# Patient Record
Sex: Male | Born: 1955 | Race: Black or African American | Hispanic: No | Smoking: Current every day smoker
Health system: Southern US, Community
[De-identification: ages and names within clinical notes are randomized; demographics above are authoritative.]

## PROBLEM LIST (undated history)

## (undated) DIAGNOSIS — I10 Essential (primary) hypertension: Secondary | ICD-10-CM

## (undated) DIAGNOSIS — E119 Type 2 diabetes mellitus without complications: Secondary | ICD-10-CM

## (undated) HISTORY — PX: BELOW KNEE LEG AMPUTATION: SUR23

---

## 2015-09-08 ENCOUNTER — Inpatient Hospital Stay
Admission: EM | Admit: 2015-09-08 | Discharge: 2015-09-09 | DRG: 292 | Disposition: A | Discharge status: Skilled Nursing Facility | Payer: Medicare Other | Attending: Internal Medicine | Admitting: Internal Medicine

## 2015-09-08 ENCOUNTER — Inpatient Hospital Stay
Admit: 2015-09-08 | Discharge: 2015-09-08 | Disposition: A | Discharge status: Home / Self Care | Payer: Medicare Other | Attending: Internal Medicine | Admitting: Internal Medicine

## 2015-09-08 ENCOUNTER — Emergency Department: Payer: Medicare Other

## 2015-09-08 ENCOUNTER — Encounter: Payer: Self-pay | Admitting: Emergency Medicine

## 2015-09-08 DIAGNOSIS — M47892 Other spondylosis, cervical region: Secondary | ICD-10-CM | POA: Diagnosis present

## 2015-09-08 DIAGNOSIS — I5021 Acute systolic (congestive) heart failure: Secondary | ICD-10-CM | POA: Diagnosis present

## 2015-09-08 DIAGNOSIS — I110 Hypertensive heart disease with heart failure: Principal | ICD-10-CM | POA: Diagnosis present

## 2015-09-08 DIAGNOSIS — E1151 Type 2 diabetes mellitus with diabetic peripheral angiopathy without gangrene: Secondary | ICD-10-CM | POA: Diagnosis present

## 2015-09-08 DIAGNOSIS — R0902 Hypoxemia: Secondary | ICD-10-CM | POA: Diagnosis present

## 2015-09-08 DIAGNOSIS — Z7982 Long term (current) use of aspirin: Secondary | ICD-10-CM | POA: Diagnosis not present

## 2015-09-08 DIAGNOSIS — Z833 Family history of diabetes mellitus: Secondary | ICD-10-CM | POA: Diagnosis not present

## 2015-09-08 DIAGNOSIS — R0600 Dyspnea, unspecified: Secondary | ICD-10-CM | POA: Diagnosis present

## 2015-09-08 DIAGNOSIS — R0602 Shortness of breath: Secondary | ICD-10-CM | POA: Diagnosis present

## 2015-09-08 DIAGNOSIS — E871 Hypo-osmolality and hyponatremia: Secondary | ICD-10-CM | POA: Diagnosis present

## 2015-09-08 DIAGNOSIS — E1140 Type 2 diabetes mellitus with diabetic neuropathy, unspecified: Secondary | ICD-10-CM | POA: Diagnosis present

## 2015-09-08 DIAGNOSIS — I509 Heart failure, unspecified: Secondary | ICD-10-CM

## 2015-09-08 DIAGNOSIS — Z794 Long term (current) use of insulin: Secondary | ICD-10-CM

## 2015-09-08 DIAGNOSIS — Z89519 Acquired absence of unspecified leg below knee: Secondary | ICD-10-CM

## 2015-09-08 DIAGNOSIS — F17210 Nicotine dependence, cigarettes, uncomplicated: Secondary | ICD-10-CM | POA: Diagnosis present

## 2015-09-08 DIAGNOSIS — Z79899 Other long term (current) drug therapy: Secondary | ICD-10-CM

## 2015-09-08 DIAGNOSIS — Z89512 Acquired absence of left leg below knee: Secondary | ICD-10-CM

## 2015-09-08 HISTORY — DX: Type 2 diabetes mellitus without complications: E11.9

## 2015-09-08 HISTORY — DX: Essential (primary) hypertension: I10

## 2015-09-08 LAB — CBC WITH DIFFERENTIAL/PLATELET
BASOS ABS: 0.3 10*3/uL — AB (ref 0–0.1)
EOS ABS: 0.6 10*3/uL (ref 0–0.7)
Eosinophils Relative: 6 %
HEMATOCRIT: 39.7 % — AB (ref 40.0–52.0)
HEMOGLOBIN: 13.7 g/dL (ref 13.0–18.0)
Lymphocytes Relative: 21 %
Lymphs Abs: 2 10*3/uL (ref 1.0–3.6)
MCH: 31.4 pg (ref 26.0–34.0)
MCHC: 34.4 g/dL (ref 32.0–36.0)
MCV: 91.2 fL (ref 80.0–100.0)
Monocytes Absolute: 0.7 10*3/uL (ref 0.2–1.0)
NEUTROS ABS: 5.9 10*3/uL (ref 1.4–6.5)
PLATELETS: 280 10*3/uL (ref 150–440)
RBC: 4.35 MIL/uL — AB (ref 4.40–5.90)
RDW: 14.2 % (ref 11.5–14.5)
WBC: 9.5 10*3/uL (ref 3.8–10.6)

## 2015-09-08 LAB — ECHOCARDIOGRAM COMPLETE
Height: 70 in
Weight: 3728 oz

## 2015-09-08 LAB — GLUCOSE, CAPILLARY
GLUCOSE-CAPILLARY: 258 mg/dL — AB (ref 65–99)
GLUCOSE-CAPILLARY: 417 mg/dL — AB (ref 65–99)
GLUCOSE-CAPILLARY: 282 mg/dL — AB (ref 65–99)
Glucose-Capillary: 100 mg/dL — ABNORMAL HIGH (ref 65–99)
Glucose-Capillary: 141 mg/dL — ABNORMAL HIGH (ref 65–99)

## 2015-09-08 LAB — TROPONIN I
Troponin I: <0.03 ng/mL (ref <0.031)
Troponin I: <0.03 ng/mL (ref <0.031)

## 2015-09-08 LAB — COMPREHENSIVE METABOLIC PANEL
ALK PHOS: 118 U/L (ref 38–126)
ALT: 9 U/L — ABNORMAL LOW (ref 17–63)
ANION GAP: 7 (ref 5–15)
AST: 14 U/L — ABNORMAL LOW (ref 15–41)
Albumin: 3.9 g/dL (ref 3.5–5.0)
BUN: 22 mg/dL — ABNORMAL HIGH (ref 6–20)
CALCIUM: 9.4 mg/dL (ref 8.9–10.3)
CO2: 23 mmol/L (ref 22–32)
Chloride: 100 mmol/L — ABNORMAL LOW (ref 101–111)
Creatinine, Ser: 0.79 mg/dL (ref 0.61–1.24)
Glucose, Bld: 227 mg/dL — ABNORMAL HIGH (ref 65–99)
Potassium: 4.3 mmol/L (ref 3.5–5.1)
SODIUM: 130 mmol/L — AB (ref 135–145)
TOTAL PROTEIN: 7.3 g/dL (ref 6.5–8.1)
Total Bilirubin: 0.6 mg/dL (ref 0.3–1.2)

## 2015-09-08 LAB — BRAIN NATRIURETIC PEPTIDE: B Natriuretic Peptide: 392 pg/mL — ABNORMAL HIGH (ref 0.0–100.0)

## 2015-09-08 MED ORDER — TRAZODONE HCL 100 MG PO TABS
100.0000 mg | ORAL_TABLET | Freq: Every day | ORAL | Status: DC
  Administered 2015-09-08: 100 mg via ORAL
  Filled 2015-09-08: qty 1

## 2015-09-08 MED ORDER — TAMSULOSIN HCL 0.4 MG PO CAPS
0.4000 mg | ORAL_CAPSULE | Freq: Every day | ORAL | Status: DC
  Administered 2015-09-08 – 2015-09-09 (×2): 0.4 mg via ORAL
  Filled 2015-09-08 (×2): qty 1

## 2015-09-08 MED ORDER — ASPIRIN EC 81 MG PO TBEC
81.0000 mg | DELAYED_RELEASE_TABLET | Freq: Every day | ORAL | Status: DC
  Administered 2015-09-08 – 2015-09-09 (×2): 81 mg via ORAL
  Filled 2015-09-08 (×2): qty 1

## 2015-09-08 MED ORDER — FUROSEMIDE 10 MG/ML IJ SOLN
40.0000 mg | Freq: Two times a day (BID) | INTRAMUSCULAR | Status: DC
  Administered 2015-09-08 – 2015-09-09 (×3): 40 mg via INTRAVENOUS
  Filled 2015-09-08 (×3): qty 4

## 2015-09-08 MED ORDER — POTASSIUM CHLORIDE CRYS ER 20 MEQ PO TBCR
20.0000 meq | EXTENDED_RELEASE_TABLET | Freq: Every day | ORAL | Status: DC
  Administered 2015-09-08 – 2015-09-09 (×2): 20 meq via ORAL
  Filled 2015-09-08 (×2): qty 1

## 2015-09-08 MED ORDER — ENOXAPARIN SODIUM 40 MG/0.4ML ~~LOC~~ SOLN
40.0000 mg | SUBCUTANEOUS | Status: DC
  Administered 2015-09-08 – 2015-09-09 (×2): 40 mg via SUBCUTANEOUS
  Filled 2015-09-08 (×2): qty 0.4

## 2015-09-08 MED ORDER — SODIUM CHLORIDE 0.9% FLUSH: 3.0000 mL | INTRAVENOUS | Status: DC | PRN

## 2015-09-08 MED ORDER — SODIUM CHLORIDE 0.9% FLUSH
3.0000 mL | Freq: Two times a day (BID) | INTRAVENOUS | Status: DC
  Administered 2015-09-08 (×2): 3 mL via INTRAVENOUS

## 2015-09-08 MED ORDER — INSULIN ASPART 100 UNIT/ML ~~LOC~~ SOLN
10.0000 [IU] | Freq: Once | SUBCUTANEOUS | Status: AC
Start: 2015-09-08 — End: 2015-09-08
  Administered 2015-09-08: 10 [IU] via SUBCUTANEOUS
  Filled 2015-09-08: qty 10

## 2015-09-08 MED ORDER — CARVEDILOL 3.125 MG PO TABS
3.1250 mg | ORAL_TABLET | Freq: Two times a day (BID) | ORAL | Status: DC
  Administered 2015-09-08 – 2015-09-09 (×2): 3.125 mg via ORAL
  Filled 2015-09-08 (×2): qty 1

## 2015-09-08 MED ORDER — ACETAMINOPHEN 325 MG PO TABS: 650.0000 mg | ORAL_TABLET | ORAL | Status: DC | PRN

## 2015-09-08 MED ORDER — ALBUTEROL SULFATE (2.5 MG/3ML) 0.083% IN NEBU
5.0000 mg | INHALATION_SOLUTION | Freq: Once | RESPIRATORY_TRACT | Status: AC
  Administered 2015-09-08: 5 mg via RESPIRATORY_TRACT
  Filled 2015-09-08: qty 6

## 2015-09-08 MED ORDER — SODIUM CHLORIDE 0.9 % IV SOLN: 250.0000 mL | INTRAVENOUS | Status: DC | PRN

## 2015-09-08 MED ORDER — FUROSEMIDE 10 MG/ML IJ SOLN
60.0000 mg | Freq: Once | INTRAMUSCULAR | Status: AC
  Administered 2015-09-08: 60 mg via INTRAVENOUS
  Filled 2015-09-08: qty 8

## 2015-09-08 MED ORDER — SPIRONOLACTONE 25 MG PO TABS
25.0000 mg | ORAL_TABLET | Freq: Every day | ORAL | Status: DC
  Administered 2015-09-08: 25 mg via ORAL
  Filled 2015-09-08: qty 1

## 2015-09-08 MED ORDER — NITROGLYCERIN 2 % TD OINT: 1.0000 [in_us] | TOPICAL_OINTMENT | Freq: Once | TRANSDERMAL | Status: DC

## 2015-09-08 MED ORDER — INSULIN GLARGINE 100 UNIT/ML ~~LOC~~ SOLN
15.0000 [IU] | Freq: Two times a day (BID) | SUBCUTANEOUS | Status: DC
  Administered 2015-09-08 – 2015-09-09 (×3): 15 [IU] via SUBCUTANEOUS
  Filled 2015-09-08 (×6): qty 0.15

## 2015-09-08 MED ORDER — INSULIN ASPART 100 UNIT/ML ~~LOC~~ SOLN
0.0000 [IU] | Freq: Three times a day (TID) | SUBCUTANEOUS | Status: DC
  Administered 2015-09-08 – 2015-09-09 (×3): 5 [IU] via SUBCUTANEOUS
  Filled 2015-09-08 (×2): qty 5
  Filled 2015-09-08: qty 3

## 2015-09-08 MED ORDER — INSULIN ASPART 100 UNIT/ML ~~LOC~~ SOLN: 0.0000 [IU] | Freq: Every day | SUBCUTANEOUS | Status: DC

## 2015-09-08 MED ORDER — ONDANSETRON HCL 4 MG/2ML IJ SOLN: 4.0000 mg | Freq: Four times a day (QID) | INTRAMUSCULAR | Status: DC | PRN

## 2015-09-08 MED ORDER — INSULIN REGULAR HUMAN 100 UNIT/ML IJ SOLN: 10.0000 [IU] | Freq: Once | INTRAMUSCULAR | Status: DC

## 2015-09-08 MED ORDER — LISINOPRIL 5 MG PO TABS
5.0000 mg | ORAL_TABLET | Freq: Every day | ORAL | Status: DC
  Administered 2015-09-08 – 2015-09-09 (×2): 5 mg via ORAL
  Filled 2015-09-08 (×2): qty 1

## 2015-09-08 MED ORDER — ATORVASTATIN CALCIUM 20 MG PO TABS
20.0000 mg | ORAL_TABLET | Freq: Every day | ORAL | Status: DC
  Administered 2015-09-08: 20 mg via ORAL
  Filled 2015-09-08 (×2): qty 1

## 2015-09-08 NOTE — Progress Notes (Signed)
*  PRELIMINARY RESULTS* Echocardiogram 2D Echocardiogram has been performed.  Brett Mccann 09/08/2015, 1:55 PM

## 2015-09-08 NOTE — Progress Notes (Signed)
Patient admitted to unit from ED, for dyspnea and SOB,. Patient alert and oriented, denies any pain at this time, vss, mood calm will continue to monitor \.

## 2015-09-08 NOTE — Progress Notes (Signed)
Patient BS 417, MD notified, new order received, will continue to monitor.

## 2015-09-08 NOTE — NC FL2 (Signed)
[place] MEDICAID FL2 LEVEL OF CARE SCREENING TOOL     IDENTIFICATION  Patient Name: Brett Mccann Birthdate: [birth year] Sex: male Admission Date (Current Location): 09/08/2015  [place] and [place] Number:  Chiropodist and Address:  [facility], [address] [place], [place] [address]      Provider Number: [id number]  Attending Physician Name and Address:  Altamese Dilling, MD  Relative Name and Phone Number:       Current Level of Care: Hospital Recommended Level of Care: Skilled Nursing Facility Prior Approval Number:    Date Approved/Denied:   PASRR Number: [id number]  Discharge Plan: SNF    Current Diagnoses: Patient Active Problem List   Diagnosis Date Noted  . Dyspnea 09/08/2015  . CHF (congestive heart failure) (HCC) 09/08/2015    Orientation RESPIRATION BLADDER Height & Weight     Self, Situation, Place  Normal, O2 (2 liters) Incontinent Weight: 233 lb (105.688 kg) Height:   (177.8 cm)  BEHAVIORAL SYMPTOMS/MOOD NEUROLOGICAL BOWEL NUTRITION STATUS   (none)  (none) Incontinent Diet (heart healthy/carb modified)  AMBULATORY STATUS COMMUNICATION OF NEEDS Skin   Extensive Assist Verbally Normal                       Personal Care Assistance Level of Assistance  Dressing, Bathing Bathing Assistance: Limited assistance   Dressing Assistance: Limited assistance     Functional Limitations Info             SPECIAL CARE FACTORS FREQUENCY                       Contractures Contractures Info: Not present    Additional Factors Info  Code Status, Allergies Code Status Info: full Allergies Info: nka           Current Medications (09/08/2015):  This is the current hospital active medication list Current Facility-Administered Medications  Medication Dose Route Frequency Provider Last Rate Last Dose  . 0.9 %  sodium chloride infusion  250 mL Intravenous PRN Ihor Austin, MD      .  acetaminophen (TYLENOL) tablet 650 mg  650 mg Oral Q4H PRN Ihor Austin, MD      . aspirin EC tablet 81 mg  81 mg Oral Daily Pavan Pyreddy, MD   81 mg at 09/08/15 1004  . atorvastatin (LIPITOR) tablet 20 mg  20 mg Oral q1800 Altamese Dilling, MD      . carvedilol (COREG) tablet 3.125 mg  3.125 mg Oral BID WC Pavan Pyreddy, MD      . enoxaparin (LOVENOX) injection 40 mg  40 mg Subcutaneous Q24H Pavan Pyreddy, MD   40 mg at 09/08/15 1004  . furosemide (LASIX) injection 40 mg  40 mg Intravenous Q12H Pavan Pyreddy, MD   40 mg at 09/08/15 1004  . insulin aspart (novoLOG) injection 0-9 Units  0-9 Units Subcutaneous TID WC Altamese Dilling, MD   5 Units at 09/08/15 1354  . insulin glargine (LANTUS) injection 15 Units  15 Units Subcutaneous BID Altamese Dilling, MD   15 Units at 09/08/15 1355  . lisinopril (PRINIVIL,ZESTRIL) tablet 5 mg  5 mg Oral Daily Ihor Austin, MD   5 mg at 09/08/15 1004  . nitroGLYCERIN (NITROGLYN) 2 % ointment 1 inch  1 inch Topical Once Jeanmarie Plant, MD   1 inch at 09/08/15 0508  . ondansetron (ZOFRAN) injection 4 mg  4 mg Intravenous Q6H PRN Ihor Austin, MD      .  potassium chloride SA (K-DUR,KLOR-CON) CR tablet 20 mEq  20 mEq Oral Daily Ihor AustinPavan Pyreddy, MD   20 mEq at 09/08/15 1004  . sodium chloride flush (NS) 0.9 % injection 3 mL  3 mL Intravenous Q12H Pavan Pyreddy, MD   3 mL at 09/08/15 1005  . sodium chloride flush (NS) 0.9 % injection 3 mL  3 mL Intravenous PRN Pavan Pyreddy, MD      . spironolactone (ALDACTONE) tablet 25 mg  25 mg Oral Daily Altamese DillingVaibhavkumar Destaney Sarkis, MD   25 mg at 09/08/15 1356     Discharge Medications: Please see discharge summary for a list of discharge medications.  Relevant Imaging Results:  Relevant Lab Results:   Additional Information    York SpanielMonica Marra, LCSW

## 2015-09-08 NOTE — Consult Note (Signed)
[facility]  CARDIOLOGY CONSULT NOTE  Patient ID: Brett Mccann MRN: [id number] DOB/AGE: 06/22/1955 60 y.o.  Admit date: 09/08/2015 Referring Physician Dr. Elisabeth PigeonVachhani Primary Physician   Primary Cardiologist [facility] Reason for Consultation chf  HPI: Mr. 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 Chest x-ray showed mild pulmonary edema.. He had a stress test approximately one year ago that showed no evidence of ischemia. There was inferior infarction versus attenuation artifact. His ejection fraction was 32%. He has been living at a nursing facility for the last several months. Patient states that his shortness of breath occurs when he is anxious. He has had a lot of anxiety attacks recently. He states he is compliant with his medications. He has ruled out for myocardial infarction with negative troponin. He denies chest pain orthopnea or PND. Echocardiogram is pending from this admission.   Review of Systems  HENT: Negative.   Eyes: Negative.   Respiratory: Positive for shortness of breath.   Cardiovascular: Negative.   Gastrointestinal:  Negative.   Genitourinary: Negative.   Musculoskeletal: Negative.   Skin: Negative.   Neurological: Positive for weakness.  Endo/Heme/Allergies: Negative.   Psychiatric/Behavioral: The patient is nervous/anxious.     Past Medical History  Diagnosis Date  . Diabetes mellitus without complication (HCC)   . Hypertension     Family History  Problem Relation Age of Onset  . Diabetes Father     Social History   Social History  . Marital Status: Widowed    Spouse Name: N/A  . Number of Children: N/A  . Years of Education: N/A   Occupational History  . disabled    Social History Main Topics  . Smoking status: Current Every Day Smoker  . Smokeless tobacco: Not on file  . Alcohol Use: No  . Drug Use: No  . Sexual Activity: Not on file   Other Topics Concern  . Not on file   Social History Narrative  . No narrative on file    Past Surgical History  Procedure Laterality Date  . Below knee leg amputation       Prescriptions prior to admission  Medication Sig Dispense Refill Last Dose  . acetaminophen (TYLENOL) 325 MG tablet Take 650 mg by mouth every 4 (four) hours as needed for mild pain.   prn at prn  . aspirin EC 81 MG tablet Take 81 mg by mouth daily.   unknown at unknown  . bisacodyl (DULCOLAX) 5 MG EC tablet Take 10 mg by mouth at bedtime as needed for moderate constipation.   prn at prn  . cyclobenzaprine (FLEXERIL) 5 MG tablet Take 5 mg by mouth 3 (three) times daily as needed for muscle spasms.   prn at prn  . dextromethorphan-guaiFENesin (ROBITUSSIN-DM) 10-100 MG/5ML liquid Take 10 mLs by mouth every 6 (six) hours as needed for cough.  prn at prn  . diclofenac sodium (VOLTAREN) 1 % GEL Apply 2-4 g topically 4 (four) times daily.    unknown at unknown  . folic acid (FOLVITE) 1 MG tablet Take 1 mg by mouth daily.   unknown at unknown  . insulin glargine (LANTUS) 100 unit/mL SOPN Inject 18 Units into the skin 2 (two) times daily.    unknown at unknown  . insulin  lispro (HUMALOG KWIKPEN) 100 UNIT/ML KiwkPen Inject 4 Units into the skin 3 (three) times daily.   unknown at unknown  . ketoconazole (NIZORAL) 2 % cream Apply 1 application topically daily.   unknown at unknown  . lisinopril (PRINIVIL,ZESTRIL) 5 MG tablet Take 5 mg by mouth daily.   unknown at unknown  . loratadine (CLARITIN) 10 MG tablet Take 10 mg by mouth at bedtime.   unknown at unknown  . magnesium hydroxide (MILK OF MAGNESIA) 400 MG/5ML suspension Take 15 mLs by mouth daily as needed for mild constipation.   prn at prn  . magnesium oxide (MAG-OX) 400 MG tablet Take 400 mg by mouth 3 (three) times daily.   unknown at unknown  . Melatonin 3 MG TABS Take 3 mg by mouth at bedtime.   unknown at unknown  . Menthol-Methyl Salicylate (BEN GAY GREASELESS) 10-15 % greaseless cream Apply 1 application topically every 6 (six) hours as needed for pain.   prn at prn  . metFORMIN (GLUCOPHAGE) 500 MG tablet Take 500 mg by mouth 2 (two) times daily with a meal.   unknown at unknown  . metoprolol succinate (TOPROL-XL) 25 MG 24 hr tablet Take 12.5 mg by mouth daily.   unknown at unknown  . Multiple Vitamin (MULTIVITAMIN WITH MINERALS) TABS tablet Take 1 tablet by mouth daily.   unknown at unknown  . tamsulosin (FLOMAX) 0.4 MG CAPS capsule Take 0.4 mg by mouth daily.   unknown at unknown  . traZODone (DESYREL) 100 MG tablet Take 100 mg by mouth at bedtime.   unknown at unknown    Physical Exam: Blood pressure 173/85, pulse 102, temperature 98.4 F (36.9 C), temperature source Oral, resp. rate 19, height  (1.778 m), weight 105.688 kg (233 lb), SpO2 100 %.   Wt Readings from Last 1 Encounters:  09/08/15 105.688 kg (233 lb)     General appearance: alert and cooperative Head: Normocephalic, without obvious abnormality, atraumatic Resp: clear to auscultation bilaterally Cardio: regular rate and rhythm GI: soft, non-tender; bowel sounds normal; no masses,  no organomegaly Extremities: Status post left  BKA Neurologic: Grossly normal  Labs:   Lab Results  Component Value Date   WBC 9.5 09/08/2015   HGB 13.7 09/08/2015   HCT 39.7* 09/08/2015   MCV 91.2 09/08/2015   PLT 280 09/08/2015    Recent Labs Lab 09/08/15 0353  NA 130*  K 4.3  CL 100*  CO2 23  BUN 22*  CREATININE 0.79  CALCIUM 9.4  PROT 7.3  BILITOT 0.6  ALKPHOS 118  ALT 9*  AST 14*  GLUCOSE 227*   Lab Results  Component Value Date   TROPONINI <0.03 09/08/2015      Radiology: Mild vascular and interstitial prominence suggestive of possible mild congestive heart failure. 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 He presents with shortness of breath and  fatigue. Chest x-ray revealed possible mild pulmonary edema. He is ruled out for myocardial infarction. He states he feels better since admission with diuresis. He states a lot of his symptoms are coming from anxiety. He is compliant with his medications. Continue with careful diuresis with furosemide 40 IV every 12 and converted to by mouth in the morning following his renal function. Would continue with carvedilol at 3.125 mg twice daily.Maryclare Labrador continue with atorvastatin 20 mg daily for anemia. He is also on spironolactone 25 mg appears to be doing well. Signed: Dalia Heading MD, [facility] 09/08/2015, 4:48 PM

## 2015-09-08 NOTE — Progress Notes (Signed)
Came with CHF exacerbation, have EF 20% Feels little better. C/o could not sleep last night being in ER  Blood sugar was high- so insulin added.  Seen pt around 1 pm.            5

## 2015-09-08 NOTE — ED Notes (Signed)
Pt [facility] EMS [facility] with c/o of SOB. EMS reports pt woke staff up stating he couldn't get his breath. Upon arrival to ED pt is 88% on RA, nasal canula applied with 2L, stats at 96%.

## 2015-09-08 NOTE — Care Management (Signed)
Patient from [facility]. CSW consult pending.

## 2015-09-08 NOTE — H&P (Signed)
[facility] - [place] at [facility]   PATIENT NAME: Brett Mccann    MR#:  [id number]  DATE OF BIRTH:  29-Aug-1955  DATE OF ADMISSION:  09/08/2015  PRIMARY CARE PHYSICIAN: Pcp Not In System   REQUESTING/REFERRING PHYSICIAN:   CHIEF COMPLAINT:   Chief Complaint  Patient presents with  . Shortness of Breath    HISTORY OF PRESENT ILLNESS: Brett Mccann  is a 60 y.o. male with a known history of Diabetes mellitus, hypertension who is a resident of a rehabilitation facility and presented to the emergency room with shortness of breath. Patient had a sudden shortness of breath yesterday and his O2 sats around 80%. He was brought to the emergency room was put on oxygen via nasal cannula and given Lasix for diuresis. Was found to be overloaded with fluid had some edema in the right lower extremity and he sounded wet. Patient was worked up with chest x-ray which showed a congestive heart failure. Patient has a difficulty breathing and orthopnea. No complaints of any chest pain. Patient has left below-knee amputation. Currently not on any Lasix , patient says he has been off Lasix as recommended by cardiology. First set of troponin is negative. No history of fever or chills or cough. No history of headache dizziness or blurry vision. No history of any syncope or seizure.  PAST MEDICAL HISTORY:   Past Medical History  Diagnosis Date  . Diabetes mellitus without complication (HCC)   . Hypertension     PAST SURGICAL HISTORY: Past Surgical History  Procedure Laterality Date  . Below knee leg amputation      SOCIAL HISTORY:  Social History  Substance Use Topics  . Smoking status: Current Every Day Smoker  . Smokeless tobacco: Not on file  . Alcohol Use: No    FAMILY HISTORY:  Family History  Problem Relation Age of Onset  . Diabetes Father     DRUG ALLERGIES: No Known Allergies  REVIEW OF SYSTEMS:   CONSTITUTIONAL: No fever, fatigue or weakness.  EYES: No blurred  or double vision.  EARS, NOSE, AND THROAT: No tinnitus or ear pain.  RESPIRATORY: No cough, has shortness of breath, no wheezing or hemoptysis.  CARDIOVASCULAR: No chest pain, has orthopnea, has edema.  GASTROINTESTINAL: No nausea, vomiting, diarrhea or abdominal pain.  GENITOURINARY: No dysuria, hematuria.  ENDOCRINE: No polyuria, nocturia,  HEMATOLOGY: No anemia, easy bruising or bleeding SKIN: No rash or lesion. MUSCULOSKELETAL: No joint pain or arthritis.   NEUROLOGIC: No tingling, numbness, weakness.  PSYCHIATRY: No anxiety or depression.   MEDICATIONS AT HOME:  Prior to Admission medications   Not on File      PHYSICAL EXAMINATION:   VITAL SIGNS: Blood pressure 162/104, pulse 113, temperature 98.8 F (37.1 C), temperature source Oral, resp. rate 24, height  (1.778 m), weight 99.791 kg (220 lb), SpO2 93 %.  GENERAL:  60 y.o.-year-old patient lying in the bed with no acute distress.  EYES: Pupils equal, round, reactive to light and accommodation. No scleral icterus. Extraocular muscles intact.  HEENT: Head atraumatic, normocephalic. Oropharynx and nasopharynx clear.  NECK:  Supple, no jugular venous distention. No thyroid enlargement, no tenderness.  LUNGS: Decreased breath sounds bilaterally, no wheezing, bibasilar crepitations heard. No use of accessory muscles of respiration.  CARDIOVASCULAR: S1, S2 normal. No murmurs, rubs, or gallops.  ABDOMEN: Soft, nontender, nondistended. Bowel sounds present. No organomegaly or mass.  EXTREMITIES: Has pedal edema right leg, Left BKA NEUROLOGIC: Cranial nerves II through XII  are intact. Muscle strength 5/5 in upper and right lower extremity. Sensation intact. Gait not checked.  PSYCHIATRIC: The patient is alert and oriented x 3.  SKIN: No obvious rash, lesion, or ulcer.   LABORATORY PANEL:   CBC  Recent Labs Lab 09/08/15 0353  WBC 9.5  HGB 13.7  HCT 39.7*  PLT 280  MCV 91.2  MCH 31.4  MCHC 34.4  RDW 14.2  LYMPHSABS  2.0  MONOABS 0.7  EOSABS 0.6  BASOSABS 0.3*   ------------------------------------------------------------------------------------------------------------------  Chemistries   Recent Labs Lab 09/08/15 0353  NA 130*  K 4.3  CL 100*  CO2 23  GLUCOSE 227*  BUN 22*  CREATININE 0.79  CALCIUM 9.4  AST 14*  ALT 9*  ALKPHOS 118  BILITOT 0.6   ------------------------------------------------------------------------------------------------------------------ estimated creatinine clearance is 116.3 mL/min (by C-G formula based on Cr of 0.79). ------------------------------------------------------------------------------------------------------------------ No results for input(s): TSH, T4TOTAL, T3FREE, THYROIDAB in the last 72 hours.  Invalid input(s): FREET3   Coagulation profile No results for input(s): INR, PROTIME in the last 168 hours. ------------------------------------------------------------------------------------------------------------------- No results for input(s): DDIMER in the last 72 hours. -------------------------------------------------------------------------------------------------------------------  Cardiac Enzymes  Recent Labs Lab 09/08/15 0353  TROPONINI <0.03   ------------------------------------------------------------------------------------------------------------------ Invalid input(s): POCBNP  ---------------------------------------------------------------------------------------------------------------  Urinalysis No results found for: COLORURINE, APPEARANCEUR, LABSPEC, PHURINE, GLUCOSEU, HGBUR, BILIRUBINUR, KETONESUR, PROTEINUR, UROBILINOGEN, NITRITE, LEUKOCYTESUR   RADIOLOGY: Dg Chest Port 1 View  09/08/2015  CLINICAL DATA:  Dyspnea. EXAM: PORTABLE CHEST 1 VIEW COMPARISON:  None. FINDINGS: Mild vascular and interstitial prominence. No focal consolidation. No large effusion. IMPRESSION: Mild vascular and interstitial prominence. This may  represent a degree of congestive heart failure. Electronically Signed   By: Ellery Plunkaniel R Mitchell M.D.   On: 09/08/2015 04:11    EKG: No orders found for this or any previous visit.  IMPRESSION AND PLAN: [age]year-old male patient with history of diabetes and hypertension presented to the emergency room with shortness of breath. Admitting diagnosis 1. Dyspnea 2. New onset congestive heart failure 3. Fluid overload 4. Hyponatremia 5. Hypertension 6. Type 2 diabetes mellitus Treatment plan Admit patient to telemetry  Diurese patient with IV Lasix 40 mg every 12 hourly Follow-up sodium level Oxygen via nasal cannula Cycle troponin to rule out ischemia Check LV function with echocardiogram and cardiology consultation.   All the records are reviewed and case discussed with ED provider. Management plans discussed with the patient, family and they are in agreement.  CODE STATUS:FULL Code Status History    This patient does not have a recorded code status. Please follow your organizational policy for patients in this situation.       TOTAL TIME TAKING CARE OF THIS PATIENT: 50 minutes.    Ihor AustinPavan Pyreddy M.D on 09/08/2015 at 5:54 AM  Between 7am to 6pm - Pager - [phone]  After 6pm go to [email or web address] - password [email or web address]  [facility]  Office  [phone]  CC: Primary care physician; Pcp Not In System

## 2015-09-08 NOTE — Clinical Social Work Note (Signed)
Clinical Social Work Assessment  Patient Details  Name: Brett Mccann MRN: [id number] Date of Birth: 05-16-1955  Date of referral:  09/08/15               Reason for consult:  Facility Placement                Permission sought to share information with:    Permission granted to share information::     Name::        Agency::     Relationship::     Contact Information:     Housing/Transportation Living arrangements for the past 2 months:  [facility] Source of Information:  Adult Children Patient Interpreter Needed:  None Criminal Activity/Legal Involvement Pertinent to Current Situation/Hospitalization:  No - Comment as needed Significant Relationships:  Adult Children Lives with:  Facility Resident Do you feel safe going back to the place where you live?    Need for family participation in patient care:     Care giving concerns:  Patient is a long term resident at [facility].   Social Worker assessment / plan:  Due to patient having been admitted today and being short of breath, CSW contacted his daughter: Theophilus BonesLauren Ingrum: [phone]. Ms. [name] stated patient was at a nursing home facility in [place], was hospitalized [facility]. Patient's daughter did not know that patient was hospitalized this time. Patient's daughter to contact patient's nurse to check on his status. Ms. [name] stated [facility]   Employment status:  Retired Database administratornsurance information:  Managed Medicare PT Recommendations:  Not assessed at this time Information / Referral to community resources:     Patient/Family's Response to care:  Patient's daughter expressed appreciation for CSW assistance.  Patient/Family's Understanding of and Emotional Response to Diagnosis, Current Treatment, and Prognosis:  Patient's daughter not aware of hospital admission until CSW phone call.   Emotional Assessment Appearance:  Appears stated age Attitude/Demeanor/Rapport:   Unable to Assess Affect (typically observed):  Unable to Assess Orientation:  Oriented to Self, Oriented to Place, Oriented to Situation Alcohol / Substance use:  Not Applicable Psych involvement (Current and /or in the community):  No (Comment)  Discharge Needs  Concerns to be addressed:  Care Coordination Readmission within the last 30 days:  No Current discharge risk:  None Barriers to Discharge:  No Barriers Identified   York SpanielMonica Jawanza Zambito, LCSW 09/08/2015, 3:27 PM

## 2015-09-08 NOTE — Progress Notes (Signed)
Inpatient Diabetes Program Recommendations  AACE/ADA: New Consensus Statement on Inpatient Glycemic Control (2015)  Target Ranges:  Prepandial:   less than 140 mg/dL      Peak postprandial:   less than 180 mg/dL (1-2 hours)      Critically ill patients:  140 - 180 mg/dL   Review of Glycemic Control  Results for Brett Mccann, Brett Mccann (MRN [id number]) as of 09/08/2015 11:45  Ref. Range 09/08/2015 09:20 09/08/2015 11:30  Glucose-Capillary Latest Ref Range: 65-99 mg/dL 409258 (H) 811417 (H)    Diabetes history: Type 2 Outpatient Diabetes medications: Lantus 18 units bid, Humalog 4 units tid, Metformin 500mg  bid Current orders for Inpatient glycemic control: Lantus 15 units bid, Novolog 0-9 units tid, Humulin R 10 units now  Inpatient Diabetes Program Recommendations:  Consider ordering Novolog 0-5 units qhs. Consider ordering an A1C to determine blood sugar control over the past 3 months.  Will likely require mealtime insulin- will follow.  Susette RacerJulie Jasmain Ahlberg, RN, BA, MHA, CDE Diabetes Coordinator Inpatient Diabetes Program  [phone] (Team Pager) [phone] [facility] Office) 09/08/2015 11:48 AM

## 2015-09-08 NOTE — ED Provider Notes (Addendum)
[facility] Emergency Department Provider Note  ____________________________________________   I have reviewed the triage vital signs and the nursing notes.   HISTORY  Chief Complaint Shortness of Breath    HPI Brett Mccann is a 60 y.o. male with a history of CHF, with a known low EF he is to be on significant cause of Lasix he states until his cardiologist to come off a couple months ago because he is having cramping and other implants associated with the medication apparently, presented today with no MAR because of computer issues at the primary facility where he stays. According to patient notes was subsequently followed, there is no indication that he is still taking diuretics. Patient has a history of diabetes mellitus which is been poorly controlled, alcohol abuse in the past that he is now staying at facility and does not drink he states, as well as left TKA remotely. Patient states that for the last 24 hours she has been having increased shortness of breath and dyspnea on exertion without any evidence of fever or infectious symptoms, states he lay down tonight and after lying down for a few minutes he became acutely short of breath and 911 was called. He is not on home oxygen but he was found to be hypoxic in the 80s, came up well with oxygen, sided "wet" to EMS. Denies chest pain.     Past Medical History  Diagnosis Date  . Diabetes mellitus without complication (HCC)   . Hypertension     There are no active problems to display for this patient.   History reviewed. No pertinent past surgical history.  No current outpatient prescriptions on file.  Allergies Review of patient's allergies indicates no known allergies.  History reviewed. No pertinent family history.  Social History Social History  Substance Use Topics  . Smoking status: Current Every Day Smoker  . Smokeless tobacco: None  . Alcohol Use: No    Review of Systems Constitutional:  No fever/chills Eyes: No visual changes. ENT: No sore throat. No stiff neck no neck pain Cardiovascular: Denies chest pain. Respiratory: Positive shortness of breath. Gastrointestinal:   no vomiting.  No diarrhea.  No constipation. Genitourinary: Negative for dysuria. Musculoskeletal: Chronic right lower extremity swelling Skin: Negative for rash. Neurological: Negative for headaches, focal weakness or numbness. 10-point ROS otherwise negative.  ____________________________________________   PHYSICAL EXAM:  VITAL SIGNS: ED Triage Vitals  Enc Vitals Group     BP 09/08/15 0359 162/104 mmHg     Pulse Rate 09/08/15 0359 113     Resp 09/08/15 0359 24     Temp 09/08/15 0359 98.8 F (37.1 C)     Temp Source 09/08/15 0359 Oral     SpO2 09/08/15 0348 90 %     Weight 09/08/15 0359 220 lb (99.791 kg)     Height 09/08/15 0359  (1.778 m)     Head Cir --      Peak Flow --      Pain Score --      Pain Loc --      Pain Edu? --      Excl. in GC? --     Constitutional: Alert and oriented. Well appearing and in no acute distress. Eyes: Conjunctivae are normal. PERRL. EOMI. Head: Atraumatic. Nose: No congestion/rhinnorhea. Mouth/Throat: Mucous membranes are moist.  Oropharynx non-erythematous. Neck: No stridor.   Nontender with no meningismus Cardiovascular: Normal rate, regular rhythm. Grossly normal heart sounds.  Good peripheral circulation. Respiratory positive increased  respiratory effort speaks in full sentences however, positive occasional rails and diminishment in the bases.. Abdominal: Soft and nontender. No distention. No guarding no rebound Back:  There is no focal tenderness or step off there is no midline tenderness there are no lesions noted. there is no CVA tenderness Musculoskeletal: No lower extremity tenderness. No joint effusions, no DVT signs strong distal pulses Left BKA noted, right side shows some 1-2+ pitting edema Neurologic:  Normal speech and language. No  gross focal neurologic deficits are appreciated.  Skin:  Skin is warm, dry and intact. No rash noted. Psychiatric: Mood and affect are normal. Speech and behavior are normal.  ____________________________________________   LABS (all labs ordered are listed, but only abnormal results are displayed)  Labs Reviewed  CULTURE, BLOOD (ROUTINE X 2)  CULTURE, BLOOD (ROUTINE X 2)  COMPREHENSIVE METABOLIC PANEL  CBC WITH DIFFERENTIAL/PLATELET  TROPONIN I  BRAIN NATRIURETIC PEPTIDE   ____________________________________________  EKG  I personally interpreted any EKGs ordered by me or triage Sinus tachycardia rate 115 , no acute ST elevation, borderline ST depression in aVF and lead 2, positive PVCs noted, borderline prolonged QT interval. ____________________________________________  RADIOLOGY  I reviewed any imaging ordered by me or triage that were performed during my shift and, if possible, patient and/or family made aware of any abnormal findings. ____________________________________________   PROCEDURES  Procedure(s) performed: None  Critical Care performed: CRITICAL CARE Performed by: Jeanmarie PlantJAMES A Manfred Laspina   Total critical care time: 35 minutes  Critical care time was exclusive of separately billable procedures and treating other patients.  Critical care was necessary to treat or prevent imminent or life-threatening deterioration.  Critical care was time spent personally by me on the following activities: development of treatment plan with patient and/or surrogate as well as nursing, discussions with consultants, evaluation of patient's response to treatment, examination of patient, obtaining history from patient or surrogate, ordering and performing treatments and interventions, ordering and review of laboratory studies, ordering and review of radiographic studies, pulse oximetry and re-evaluation of patient's condition.   ____________________________________________   INITIAL  IMPRESSION / ASSESSMENT AND PLAN / ED COURSE  Pertinent labs & imaging results that were available during my care of the patient were reviewed by me and considered in my medical decision making (see chart for details).   Chronic CHF patient with signs and symptoms of the Stated CHF including increased rate of breathing, rales, hypoxia and orthopnea as well as leg swelling. Chest x-ray confirms fluid volume no evidence of pneumonia. I have given the patient empiric Lasix. Heart rate is coming down to 101 at this time and he is in much less respiratory distress, giving him a neb. Blood pressure is regularizing . No evidence at this time of PE given clear other etiology. He, patient feels somewhat better as he sits straight up in the bed, and we have again given him Lasix empirically. His lab values are back we will see if patient is amenable to admission here. ____________________________________________   FINAL CLINICAL IMPRESSION(S) / ED DIAGNOSES  Final diagnoses:  SOB (shortness of breath)      This chart was dictated using voice recognition software.  Despite best efforts to proofread,  errors can occur which can change meaning.     Jeanmarie PlantJames A Damont Balles, MD 09/08/15 0451  Jeanmarie PlantJames A Relda Agosto, MD 09/08/15 [phone]

## 2015-09-08 NOTE — ED Notes (Signed)
Report given to Simone, RN.  

## 2015-09-09 LAB — BASIC METABOLIC PANEL
ANION GAP: 7 (ref 5–15)
BUN: 27 mg/dL — ABNORMAL HIGH (ref 6–20)
CALCIUM: 9.5 mg/dL (ref 8.9–10.3)
CO2: 29 mmol/L (ref 22–32)
CREATININE: 0.94 mg/dL (ref 0.61–1.24)
Chloride: 97 mmol/L — ABNORMAL LOW (ref 101–111)
GLUCOSE: 273 mg/dL — AB (ref 65–99)
Potassium: 4.7 mmol/L (ref 3.5–5.1)
Sodium: 133 mmol/L — ABNORMAL LOW (ref 135–145)

## 2015-09-09 LAB — GLUCOSE, CAPILLARY
GLUCOSE-CAPILLARY: 265 mg/dL — AB (ref 65–99)
GLUCOSE-CAPILLARY: 279 mg/dL — AB (ref 65–99)

## 2015-09-09 LAB — MRSA PCR SCREENING: MRSA by PCR: NEGATIVE

## 2015-09-09 MED ORDER — MAGNESIUM HYDROXIDE 400 MG/5ML PO SUSP: 30.0000 mL | Freq: Every day | ORAL | Status: DC | PRN

## 2015-09-09 MED ORDER — FUROSEMIDE 20 MG PO TABS: 20.0000 mg | ORAL_TABLET | Freq: Every day | ORAL | Status: AC

## 2015-09-09 MED ORDER — ATORVASTATIN CALCIUM 20 MG PO TABS: 20.0000 mg | ORAL_TABLET | Freq: Every day | ORAL | Status: AC

## 2015-09-09 MED ORDER — SPIRONOLACTONE 25 MG PO TABS: 25.0000 mg | ORAL_TABLET | Freq: Every day | ORAL | Status: AC

## 2015-09-09 MED ORDER — CARVEDILOL 3.125 MG PO TABS: 3.1250 mg | ORAL_TABLET | Freq: Two times a day (BID) | ORAL | Status: AC

## 2015-09-09 NOTE — Progress Notes (Signed)
Pt taken off 02. Room air saturation is 94%. He is a left bk amputation and  wheelchair bound; therefore unable to assess an 02 sat with exertion.

## 2015-09-09 NOTE — Progress Notes (Signed)
Pt to be discharged to [facility] Today. Iv and tele removed. Report given to jennifer at the facility. Pt to be transported by ems.

## 2015-09-09 NOTE — Progress Notes (Signed)
Patient is medically stable to discharge back to facility:  [facility] Patient is in agreement with plan as well as facility. Spoke with [place] regarding patient return to facility. Call placed to patient daughter Leotis ShamesLauren [phone],   All clinicals faxed to facility via hub. Packet completed and EMS arranged for transport.  No other needs at this time. DC to SNF.  Deretha EmoryHannah Linkin Vizzini LCSW, MSW Clinical Social Work: [facility] [employer] [phone]

## 2015-09-09 NOTE — Progress Notes (Signed)
Inpatient Diabetes Program Recommendations  AACE/ADA: New Consensus Statement on Inpatient Glycemic Control (2015)  Target Ranges:  Prepandial:   less than 140 mg/dL      Peak postprandial:   less than 180 mg/dL (1-2 hours)      Critically ill patients:  140 - 180 mg/dL   Review of Glycemic Control  Results for Brett Mccann, Brett Mccann (MRN [id number]) as of 09/09/2015 07:57  Ref. Range 09/08/2015 11:30 09/08/2015 13:47 09/08/2015 16:49 09/08/2015 20:37 09/09/2015 07:21  Glucose-Capillary Latest Ref Range: 65-99 mg/dL 409417 (H) 811282 (H) 914100 (H) 141 (H) 265 (H)    Diabetes history: Type 2 Outpatient Diabetes medications: Lantus 18 units bid, Humalog 4 units tid, Metformin 500mg  bid Current orders for Inpatient glycemic control: Lantus 15 units bid, Novolog 0-9 units tid  Inpatient Diabetes Program Recommendations:   Consider ordering Novolog 0-5 units qhs.   Consider ordering an A1C to determine blood sugar control over the past 3 months.   Consider increasing Lantus to 17 units bid and adding Novolog 3 units tid with meals (continue Novolog correction as ordered)- fasting CBG elevated  Susette RacerJulie Tiesha Marich, RN, [place], [place]HA, CDE Diabetes Coordinator Inpatient Diabetes Program  [phone] (Team Pager) [phone] [facility] Office) 09/09/2015 8:00 AM

## 2015-09-09 NOTE — Clinical Documentation Improvement (Signed)
Internal Medicine  Can the diagnosis of CHF be further specified?    Acuity - Acute, Chronic, Acute on Chronic   Type - Systolic, Diastolic, Systolic and Diastolic  Other  Clinically Undetermined   Document any associated diagnoses/conditions Supporting Information: H&P: New onset congestive heart failure 09/08/15: CHF exacerbation, have EF 20% 09/08/15 Echo: Left ventricle: Systolic function was severely reduced. The estimated ejection fraction was in the range of 20% to 25%.  Please exercise your independent, professional judgment when responding. A specific answer is not anticipated or expected. Please update your documentation within the medical record to reflect your response to this query. Thank you  Thank Barrie DunkerYou,  Whisper Kurka C Eliodoro [facility] Information Management [place] [phone]

## 2015-09-09 NOTE — Discharge Summary (Signed)
[facility] - [place] at [facility]   PATIENT NAME: Brett Mccann    MR#:  [id number]  DATE OF BIRTH:  December 01, 1955  DATE OF ADMISSION:  09/08/2015 ADMITTING PHYSICIAN: Altamese Dilling, MD  DATE OF DISCHARGE: 09/09/2015  PRIMARY CARE PHYSICIAN: Pcp Not In System    ADMISSION DIAGNOSIS:  SOB (shortness of breath) [R06.02] Hypoxia [R09.02] Acute congestive heart failure, unspecified congestive heart failure type (HCC) [I50.9]  DISCHARGE DIAGNOSIS:  Principal Problem:   CHF (congestive heart failure) (HCC) Active Problems:   Dyspnea   SECONDARY DIAGNOSIS:   Past Medical History  Diagnosis Date  . Diabetes mellitus without complication (HCC)   . Hypertension     HOSPITAL COURSE:   * Ac systolic CHF   IV lasix, responded nicely.   Switch to oral on d/c.   Added Spironolactone.   Seen by cardiologist.  * Ac hypoxic respi failure.   Due ot CHF, responded to treatment.  * Hyponatremia    Due ot diuretics, corrected.  * DM   rerstarted home dose of insulin  * Hypertension   Under control with meds.  DISCHARGE CONDITIONS:   Stable.  CONSULTS OBTAINED:  Treatment Team:  Dalia Heading, MD  DRUG ALLERGIES:  No Known Allergies  DISCHARGE MEDICATIONS:   Current Discharge Medication List    START taking these medications   Details  atorvastatin (LIPITOR) 20 MG tablet Take 1 tablet (20 mg total) by mouth daily at 6 PM. Qty: 30 tablet, Refills: 0    carvedilol (COREG) 3.125 MG tablet Take 1 tablet (3.125 mg total) by mouth 2 (two) times daily with a meal. Qty: 60 tablet, Refills: 0    furosemide (LASIX) 20 MG tablet Take 1 tablet (20 mg total) by mouth daily. Qty: 30 tablet, Refills: 0    spironolactone (ALDACTONE) 25 MG tablet Take 1 tablet (25 mg total) by mouth daily. Qty: 30 tablet, Refills: 0      CONTINUE these medications which have NOT CHANGED   Details  acetaminophen (TYLENOL) 325 MG tablet Take 650 mg by mouth  every 4 (four) hours as needed for mild pain.    aspirin EC 81 MG tablet Take 81 mg by mouth daily.    bisacodyl (DULCOLAX) 5 MG EC tablet Take 10 mg by mouth at bedtime as needed for moderate constipation.    cyclobenzaprine (FLEXERIL) 5 MG tablet Take 5 mg by mouth 3 (three) times daily as needed for muscle spasms.    dextromethorphan-guaiFENesin (ROBITUSSIN-DM) 10-100 MG/5ML liquid Take 10 mLs by mouth every 6 (six) hours as needed for cough.    diclofenac sodium (VOLTAREN) 1 % GEL Apply 2-4 g topically 4 (four) times daily.     folic acid (FOLVITE) 1 MG tablet Take 1 mg by mouth daily.    insulin glargine (LANTUS) 100 unit/mL SOPN Inject 18 Units into the skin 2 (two) times daily.     insulin lispro (HUMALOG KWIKPEN) 100 UNIT/ML KiwkPen Inject 4 Units into the skin 3 (three) times daily.    ketoconazole (NIZORAL) 2 % cream Apply 1 application topically daily.    lisinopril (PRINIVIL,ZESTRIL) 5 MG tablet Take 5 mg by mouth daily.    loratadine (CLARITIN) 10 MG tablet Take 10 mg by mouth at bedtime.    magnesium hydroxide (MILK OF MAGNESIA) 400 MG/5ML suspension Take 15 mLs by mouth daily as needed for mild constipation.    magnesium oxide (MAG-OX) 400 MG tablet Take 400 mg by mouth 3 (three) times daily.  Melatonin 3 MG TABS Take 3 mg by mouth at bedtime.    Menthol-Methyl Salicylate (BEN GAY GREASELESS) 10-15 % greaseless cream Apply 1 application topically every 6 (six) hours as needed for pain.    metFORMIN (GLUCOPHAGE) 500 MG tablet Take 500 mg by mouth 2 (two) times daily with a meal.    metoprolol succinate (TOPROL-XL) 25 MG 24 hr tablet Take 12.5 mg by mouth daily.    Multiple Vitamin (MULTIVITAMIN WITH MINERALS) TABS tablet Take 1 tablet by mouth daily.    tamsulosin (FLOMAX) 0.4 MG CAPS capsule Take 0.4 mg by mouth daily.    traZODone (DESYREL) 100 MG tablet Take 100 mg by mouth at bedtime.         DISCHARGE INSTRUCTIONS:    Follow with Cardiology in  office.  If you experience worsening of your admission symptoms, develop shortness of breath, life threatening emergency, suicidal or homicidal thoughts you must seek medical attention immediately by calling 911 or calling your MD immediately  if symptoms less severe.  You Must read complete instructions/literature along with all the possible adverse reactions/side effects for all the Medicines you take and that have been prescribed to you. Take any new Medicines after you have completely understood and accept all the possible adverse reactions/side effects.   Please note  You were cared for by a hospitalist during your hospital stay. If you have any questions about your discharge medications or the care you received while you were in the hospital after you are discharged, you can call the unit and asked to speak with the hospitalist on call if the hospitalist that took care of you is not available. Once you are discharged, your primary care physician will handle any further medical issues. Please note that NO REFILLS for any discharge medications will be authorized once you are discharged, as it is imperative that you return to your primary care physician (or establish a relationship with a primary care physician if you do not have one) for your aftercare needs so that they can reassess your need for medications and monitor your lab values.    Today   CHIEF COMPLAINT:   Chief Complaint  Patient presents with  . Shortness of Breath    HISTORY OF PRESENT ILLNESS:  Brett Mccann  is a 60 y.o. male with a known history of Diabetes mellitus, hypertension who is a resident of a rehabilitation facility and presented to the emergency room with shortness of breath. Patient had a sudden shortness of breath yesterday and his O2 sats around 80%. He was brought to the emergency room was put on oxygen via nasal cannula and given Lasix for diuresis. Was found to be overloaded with fluid had some edema in the  right lower extremity and he sounded wet. Patient was worked up with chest x-ray which showed a congestive heart failure. Patient has a difficulty breathing and orthopnea. No complaints of any chest pain. Patient has left below-knee amputation. Currently not on any Lasix , patient says he has been off Lasix as recommended by cardiology. First set of troponin is negative. No history of fever or chills or cough. No history of headache dizziness or blurry vision. No history of any syncope or seizure.   VITAL SIGNS:  Blood pressure 139/79, pulse 88, temperature 98.2 F (36.8 C), temperature source Oral, resp. rate 18, height 5\' 10"  (1.778 m), weight 101.515 kg (223 lb 12.8 oz), SpO2 97 %.  I/O:   Intake/Output Summary (Last 24 hours) at 09/09/15  1248 Last data filed at 09/09/15 1004  Gross per 24 hour  Intake    240 ml  Output    500 ml  Net   -260 ml    PHYSICAL EXAMINATION:   GENERAL: 60 y.o.-year-old patient lying in the bed with no acute distress.  EYES: Pupils equal, round, reactive to light and accommodation. No scleral icterus. Extraocular muscles intact.  HEENT: Head atraumatic, normocephalic. Oropharynx and nasopharynx clear.  NECK: Supple, no jugular venous distention. No thyroid enlargement, no tenderness.  LUNGS: Equal breath sounds bilaterally, no wheezing, Some crepitations heard. No use of accessory muscles of respiration.  CARDIOVASCULAR: S1, S2 normal. No murmurs, rubs, or gallops.  ABDOMEN: Soft, nontender, nondistended. Bowel sounds present. No organomegaly or mass.  EXTREMITIES: Has pedal edema right leg, Left BKA NEUROLOGIC: Cranial nerves II through XII are intact. Muscle strength 5/5 in upper and right lower extremity. Sensation intact. Gait not checked.  PSYCHIATRIC: The patient is alert and oriented x 3.  SKIN: No obvious rash, lesion, or ulcer.   DATA REVIEW:   CBC  Recent Labs Lab 09/08/15 0353  WBC 9.5  HGB 13.7  HCT 39.7*  PLT 280     Chemistries   Recent Labs Lab 09/08/15 0353 09/09/15 0430  NA 130* 133*  K 4.3 4.7  CL 100* 97*  CO2 23 29  GLUCOSE 227* 273*  BUN 22* 27*  CREATININE 0.79 0.94  CALCIUM 9.4 9.5  AST 14*  --   ALT 9*  --   ALKPHOS 118  --   BILITOT 0.6  --     Cardiac Enzymes  Recent Labs Lab 09/08/15 2119  TROPONINI <0.03    Microbiology Results  Results for orders placed or performed during the hospital encounter of 09/08/15  Culture, blood (routine x 2)     Status: None (Preliminary result)   Collection Time: 09/08/15  3:53 AM  Result Value Ref Range Status   Specimen Description BLOOD LEFT HAND  Final   Special Requests BOTTLES DRAWN AEROBIC AND ANAEROBIC  Final   Culture NO GROWTH < 12 HOURS  Final   Report Status PENDING  Incomplete  Culture, blood (routine x 2)     Status: None (Preliminary result)   Collection Time: 09/08/15  3:58 AM  Result Value Ref Range Status   Specimen Description BLOOD RIGHT HAND  Final   Special Requests BOTTLES DRAWN AEROBIC AND ANAEROBIC  Final   Culture NO GROWTH < 12 HOURS  Final   Report Status PENDING  Incomplete  MRSA PCR Screening     Status: None   Collection Time: 09/09/15  1:04 AM  Result Value Ref Range Status   MRSA by PCR NEGATIVE NEGATIVE Final    Comment:        The GeneXpert MRSA Assay (FDA approved for NASAL specimens only), is one component of a comprehensive MRSA colonization surveillance program. It is not intended to diagnose MRSA infection nor to guide or monitor treatment for MRSA infections.     RADIOLOGY:  Dg Chest Port 1 View  09/08/2015  CLINICAL DATA:  Dyspnea. EXAM: PORTABLE CHEST 1 VIEW COMPARISON:  None. FINDINGS: Mild vascular and interstitial prominence. No focal consolidation. No large effusion. IMPRESSION: Mild vascular and interstitial prominence. This may represent a degree of congestive heart failure. Electronically Signed   By: Ellery Plunk M.D.   On: 09/08/2015 04:11    EKG:   No orders found for this or any previous visit.  Management plans discussed with the patient, family and they are in agreement.  CODE STATUS:     Code Status Orders        Start     Ordered   09/08/15 0902  Full code   Continuous     09/08/15 0901    Code Status History    Date Active Date Inactive Code Status Order ID Comments User Context   This patient has a current code status but no historical code status.      TOTAL TIME TAKING CARE OF THIS PATIENT: 35 minutes.    Altamese Dilling M.D on 09/09/2015 at 12:48 PM  Between 7am to 6pm - Pager - [phone]  After 6pm go to [email or web address] - password [email or web address]  [facility]  Office  [phone]  CC: Primary care physician; Pcp Not In System   Note: This dictation was prepared with Dragon dictation along with smaller phrase technology. Any transcriptional errors that result from this process are unintentional.

## 2015-09-13 LAB — CULTURE, BLOOD (ROUTINE X 2)
Culture: NO GROWTH
Culture: NO GROWTH

## 2016-04-19 ENCOUNTER — Other Ambulatory Visit: Payer: Self-pay | Admitting: Neurological Surgery

## 2016-04-19 DIAGNOSIS — M545 Low back pain: Secondary | ICD-10-CM

## 2016-07-04 IMAGING — DX DG CHEST 1V PORT
1 series · 1 of 1 positions shown · non-contrast
Comparison: None.

CLINICAL DATA: Dyspnea.

EXAM:
PORTABLE CHEST 1 VIEW

[chest ap]
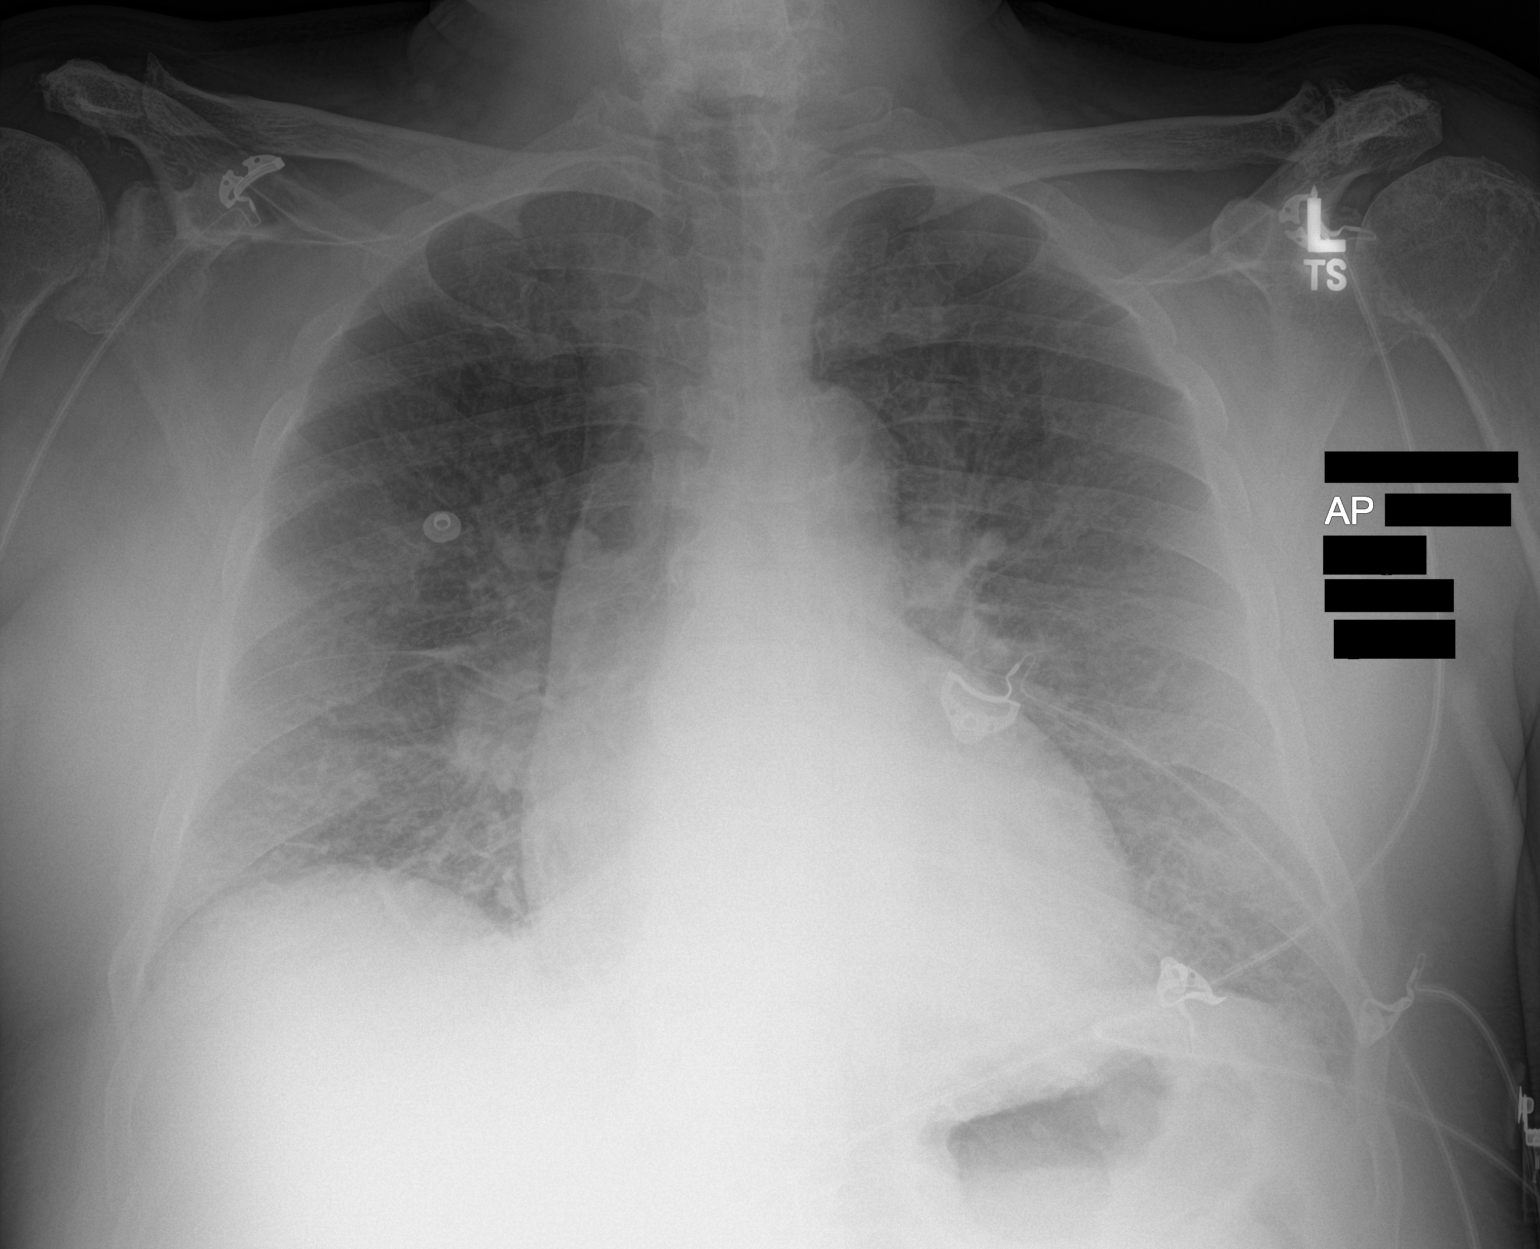

[1 of 1 positions shown; findings below may reference images not displayed]

FINDINGS: Mild vascular and interstitial prominence. No focal consolidation.
No large effusion.
IMPRESSION: Mild vascular and interstitial prominence. This may represent a
degree of congestive heart failure.

## 2018-09-29 DEATH — deceased
